# Patient Record
Sex: Male | Born: 1955 | Race: White | Hispanic: No | Marital: Married
Health system: Southern US, Community
[De-identification: ages and names within clinical notes are randomized; demographics above are authoritative.]

---

## 2018-06-02 ENCOUNTER — Other Ambulatory Visit: Payer: Self-pay | Admitting: Internal Medicine

## 2018-06-02 ENCOUNTER — Ambulatory Visit
Admission: RE | Admit: 2018-06-02 | Discharge: 2018-06-02 | Disposition: A | Discharge status: Home / Self Care | Payer: BLUE CROSS/BLUE SHIELD | Source: Ambulatory Visit | Attending: Internal Medicine | Admitting: Internal Medicine

## 2018-06-02 DIAGNOSIS — I10 Essential (primary) hypertension: Secondary | ICD-10-CM

## 2018-06-18 ENCOUNTER — Other Ambulatory Visit: Payer: Self-pay | Admitting: Internal Medicine

## 2018-06-18 ENCOUNTER — Ambulatory Visit
Admission: RE | Admit: 2018-06-18 | Discharge: 2018-06-18 | Disposition: A | Discharge status: Home / Self Care | Payer: BLUE CROSS/BLUE SHIELD | Source: Ambulatory Visit | Attending: Internal Medicine | Admitting: Internal Medicine

## 2018-06-18 DIAGNOSIS — R9389 Abnormal findings on diagnostic imaging of other specified body structures: Secondary | ICD-10-CM

## 2019-03-03 IMAGING — DX DG CHEST 2V
2 series · 2 of 2 positions shown · non-contrast
Comparison: 06/02/2018

CLINICAL DATA: Follow-up abnormal chest x-ray.

EXAM:
CHEST - 2 VIEW

[dg chest 2 view (1 of 2)]
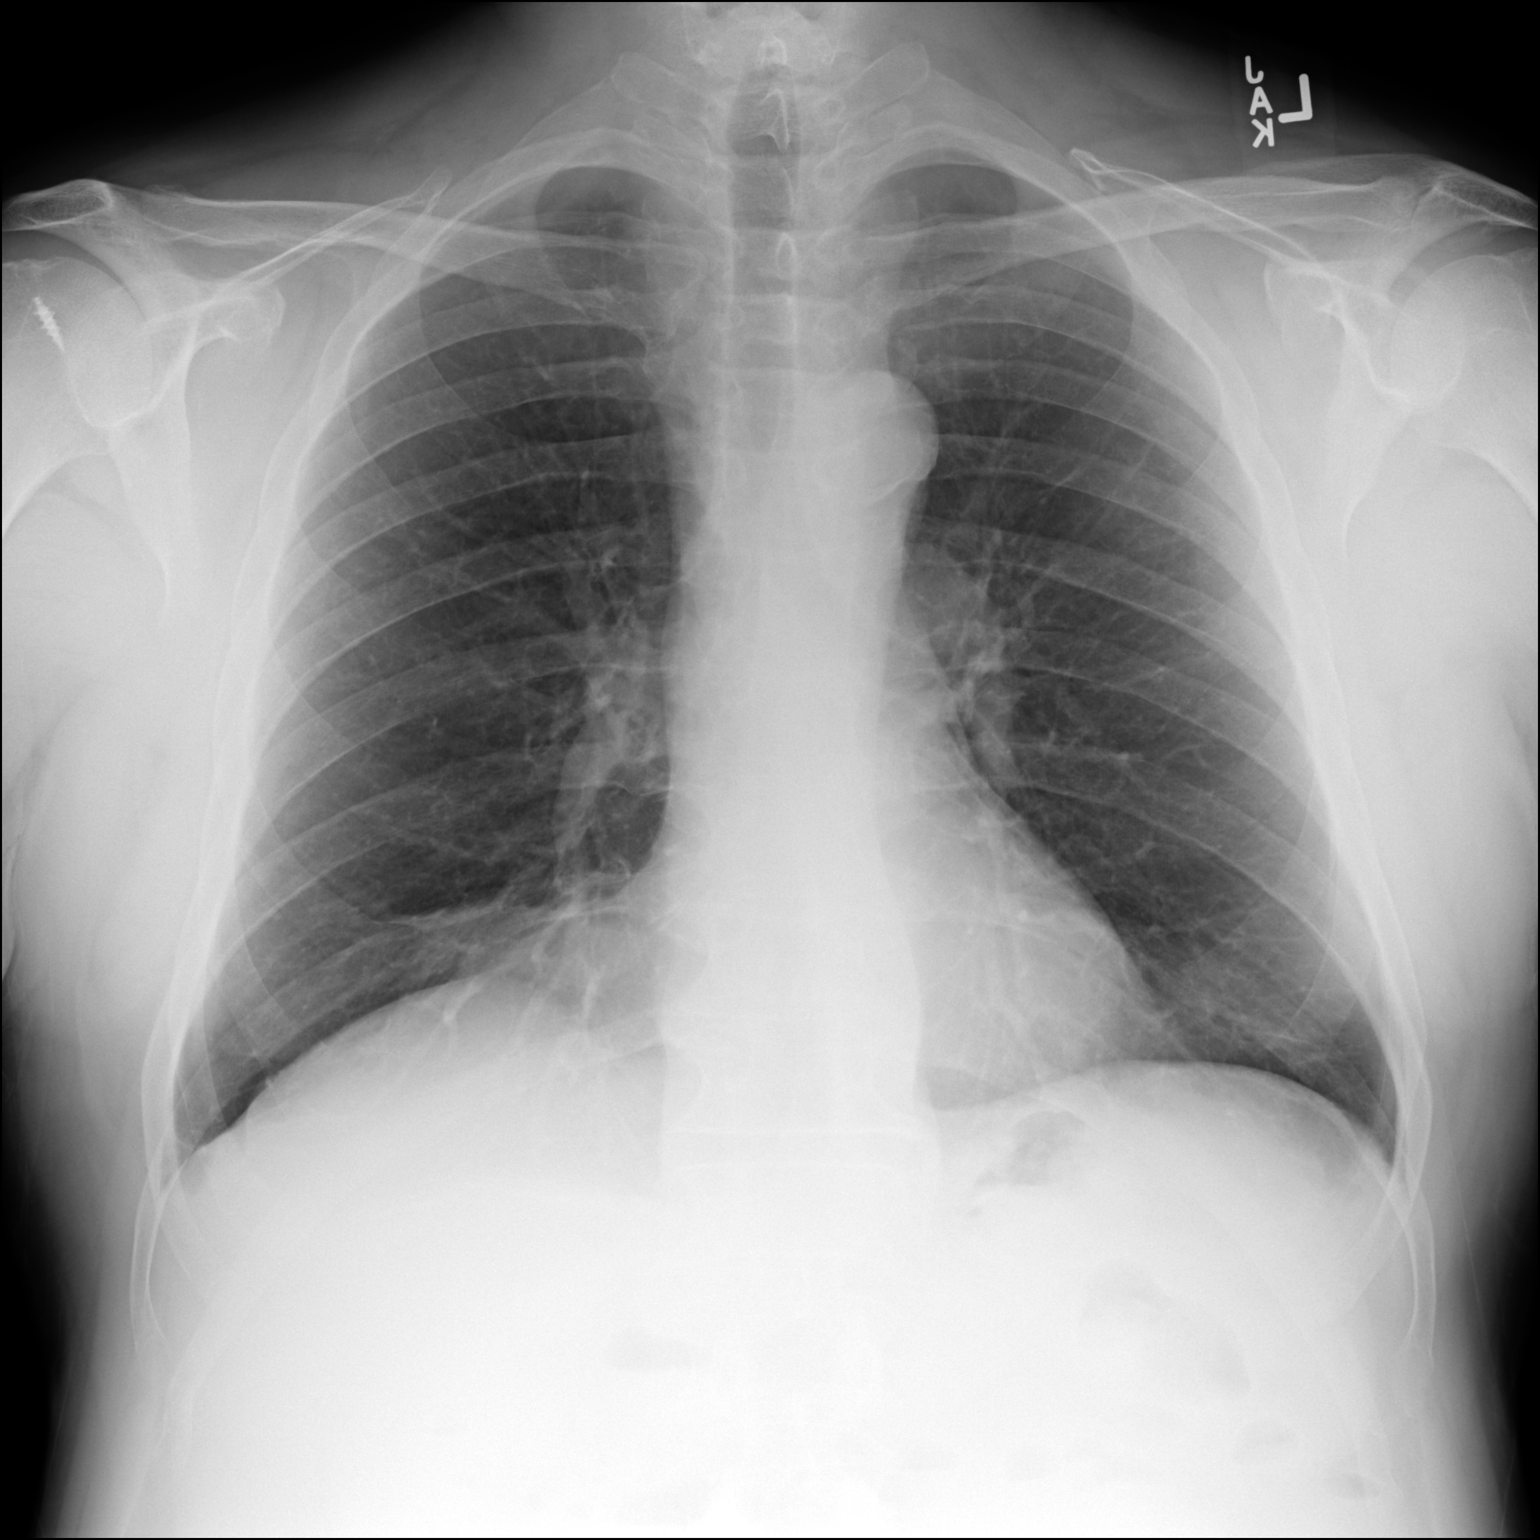

[dg chest 2 view (2 of 2)]
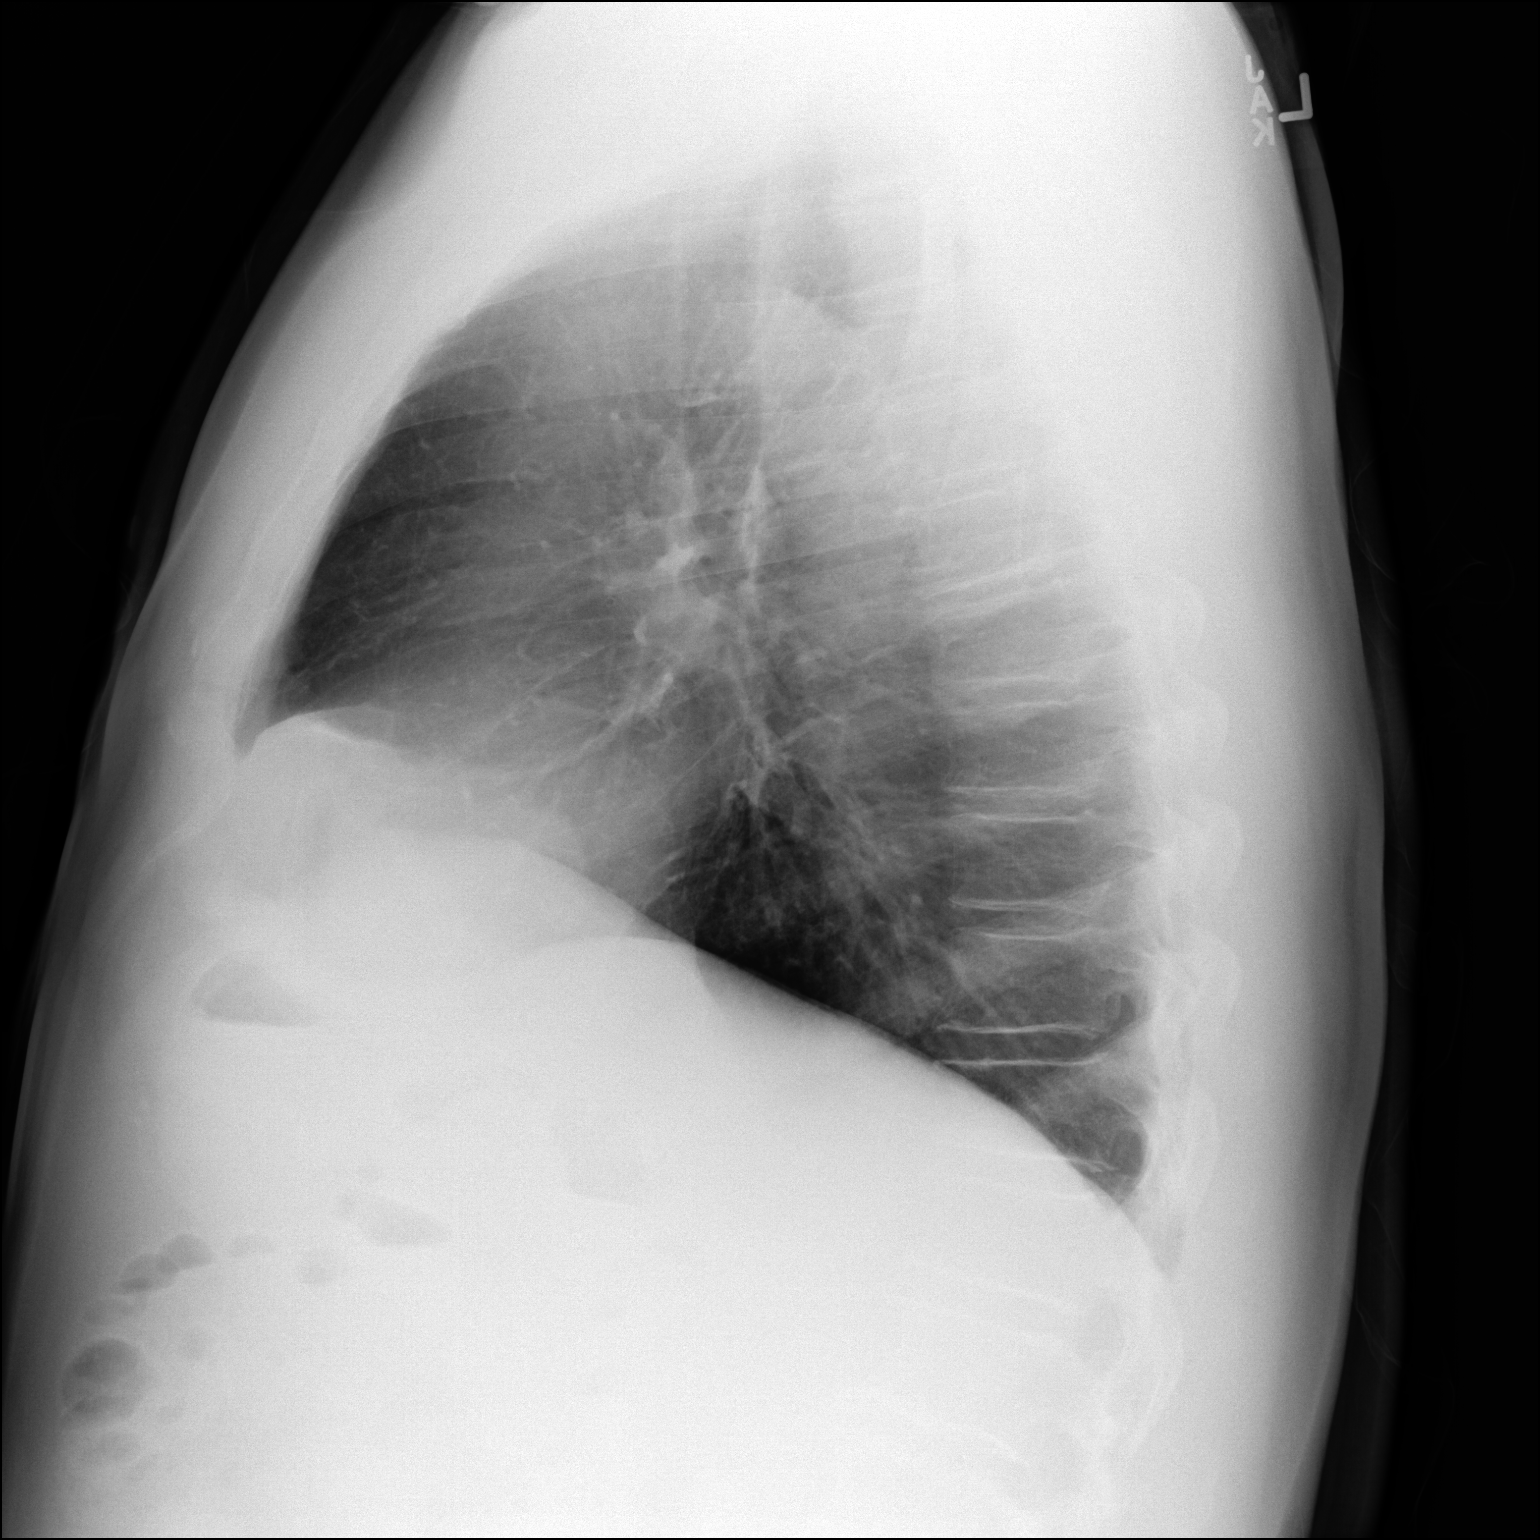

[2 of 2 positions shown; findings below may reference images not displayed]

FINDINGS: The cardiomediastinal silhouette is unchanged with normal heart
size. Aortic atherosclerosis is noted. A slightly prominent
mediastinal fat pad is present in the medial right lung base. Small
irregular density just superior to this on the prior study is not
discretely seen on today's study. Minimal linear opacity is present
in both lung bases. The lungs are clear elsewhere. No pleural
effusion or pneumothorax is identified. A suture anchor is noted in
the right humeral head.
IMPRESSION: 1. Minimal bibasilar atelectasis or scarring. No discrete lung
nodule is apparent.
2. Lungs otherwise clear.

## 2019-11-17 ENCOUNTER — Ambulatory Visit: Payer: BLUE CROSS/BLUE SHIELD | Admitting: Family Medicine

## 2020-12-08 ENCOUNTER — Other Ambulatory Visit: Payer: Self-pay | Admitting: Internal Medicine

## 2020-12-08 DIAGNOSIS — R9389 Abnormal findings on diagnostic imaging of other specified body structures: Secondary | ICD-10-CM

## 2020-12-08 DIAGNOSIS — R059 Cough, unspecified: Secondary | ICD-10-CM

## 2020-12-26 ENCOUNTER — Other Ambulatory Visit: Payer: BLUE CROSS/BLUE SHIELD

## 2020-12-26 ENCOUNTER — Ambulatory Visit
Admission: RE | Admit: 2020-12-26 | Discharge: 2020-12-26 | Disposition: A | Discharge status: Home / Self Care | Payer: BLUE CROSS/BLUE SHIELD | Source: Ambulatory Visit | Attending: Internal Medicine | Admitting: Internal Medicine

## 2020-12-26 ENCOUNTER — Other Ambulatory Visit: Payer: Self-pay

## 2020-12-26 DIAGNOSIS — R059 Cough, unspecified: Secondary | ICD-10-CM

## 2020-12-26 DIAGNOSIS — R9389 Abnormal findings on diagnostic imaging of other specified body structures: Secondary | ICD-10-CM

## 2020-12-26 MED ORDER — IOPAMIDOL (ISOVUE-300) INJECTION 61%
75.0000 mL | Freq: Once | INTRAVENOUS | Status: AC | PRN
  Administered 2020-12-26: 75 mL via INTRAVENOUS

## 2020-12-27 ENCOUNTER — Other Ambulatory Visit: Payer: BLUE CROSS/BLUE SHIELD

## 2021-10-04 ENCOUNTER — Emergency Department (HOSPITAL_COMMUNITY)
Admission: EM | Admit: 2021-10-04 | Discharge: 2021-10-05 | Disposition: A | Discharge status: Home / Self Care | Payer: Medicare Other | Attending: Emergency Medicine | Admitting: Emergency Medicine

## 2021-10-04 DIAGNOSIS — Z2914 Encounter for prophylactic rabies immune globin: Secondary | ICD-10-CM | POA: Diagnosis not present

## 2021-10-04 DIAGNOSIS — Z203 Contact with and (suspected) exposure to rabies: Secondary | ICD-10-CM | POA: Insufficient documentation

## 2021-10-04 DIAGNOSIS — Z23 Encounter for immunization: Secondary | ICD-10-CM | POA: Insufficient documentation

## 2021-10-04 DIAGNOSIS — Z209 Contact with and (suspected) exposure to unspecified communicable disease: Secondary | ICD-10-CM

## 2021-10-04 MED ORDER — RABIES IMMUNE GLOBULIN 150 UNIT/ML IM INJ
225.0000 [IU] | INJECTION | Freq: Once | INTRAMUSCULAR | Status: AC
  Administered 2021-10-04: 225 [IU] via INTRAMUSCULAR
  Filled 2021-10-04: qty 2

## 2021-10-04 MED ORDER — RABIES IMMUNE GLOBULIN 150 UNIT/ML IM INJ: 20.0000 [IU]/kg | INJECTION | Freq: Once | INTRAMUSCULAR | Status: DC

## 2021-10-04 MED ORDER — RABIES IMMUNE GLOBULIN 150 UNIT/ML IM INJ
1500.0000 [IU] | INJECTION | Freq: Once | INTRAMUSCULAR | Status: AC
  Administered 2021-10-04: 1500 [IU] via INTRAMUSCULAR
  Filled 2021-10-04: qty 10

## 2021-10-04 MED ORDER — RABIES VACCINE, PCEC IM SUSR
1.0000 mL | Freq: Once | INTRAMUSCULAR | Status: AC
  Administered 2021-10-04: 1 mL via INTRAMUSCULAR
  Filled 2021-10-04: qty 1

## 2021-10-04 NOTE — ED Provider Notes (Signed)
  [facility] EMERGENCY DEPARTMENT Provider Note   CSN: [id number] Arrival date & time: 10/04/21  1814     History  Chief Complaint  Patient presents with   Rabies Injection    Stephen Franco is a 66 y.o. male.  HPI   66 year old male presents emergency department for rabies vaccine.  Last night he and his wife woke up to a bat flying in the bedroom.  They do not believe that they were actually bitten by that but however health department referred them here for vaccination.  They have no symptoms or acute complaints at this time.  Home Medications Prior to Admission medications   Not on File      Allergies    Patient has no allergy information on record.    Review of Systems   Review of Systems  Constitutional:  Negative for fever.  Respiratory:  Negative for shortness of breath.   Cardiovascular:  Negative for chest pain.  Gastrointestinal:  Negative for abdominal pain.  Neurological:  Negative for headaches.   Physical Exam Updated Vital Signs BP 134/78 (BP Location: Right Arm)   Pulse 65   Temp 98.6 F (37 C) (Oral)   Resp 16   Wt 86.1 kg   SpO2 97%  Physical Exam Vitals and nursing note reviewed.  Constitutional:      Appearance: Normal appearance.  HENT:     Head: Normocephalic.     Mouth/Throat:     Mouth: Mucous membranes are moist.  Cardiovascular:     Rate and Rhythm: Normal rate.  Pulmonary:     Effort: Pulmonary effort is normal. No respiratory distress.  Skin:    General: Skin is warm.  Neurological:     Mental Status: He is alert and oriented to person, place, and time. Mental status is at baseline.  Psychiatric:        Mood and Affect: Mood normal.    ED Results / Procedures / Treatments   Labs (all labs ordered are listed, but only abnormal results are displayed) Labs Reviewed - No data to display  EKG None  Radiology No results found.  Procedures Procedures    Medications Ordered in ED Medications   rabies immune globulin (HYPERAB/KEDRAB) injection 1,725 Units (has no administration in time range)  rabies vaccine (RABAVERT) injection 1 mL (has no administration in time range)    ED Course/ Medical Decision Making/ A&P                           Medical Decision Making Risk Prescription drug management.   66 year old male presents emergency department for rabies treatment after finding a bat flying in the room while sleeping.  Recommendation is for receiving rabies immunoglobulin and vaccine.  Pharmacy consulted and administered.  Patient given follow-up form and information for completing the rabies vaccine regimen.  All questions answered.  Patient at this time appears safe and stable for discharge and close outpatient follow up. Discharge plan and strict return to ED precautions discussed, patient verbalizes understanding and agreement.        Final Clinical Impression(s) / ED Diagnoses Final diagnoses:  Need for prophylactic vaccination against rabies  Exposure to bat without known bite    Rx / DC Orders ED Discharge Orders     None         Rozelle Logan, DO 10/05/21 0007

## 2021-10-04 NOTE — ED Triage Notes (Signed)
Pt here for rabies vaccine, states bat was in bedroom last night, unsure if actually bitten but advised by PCP & Health Dept to get vaccinated. No symptoms, no physical complaints. ?

## 2021-10-04 NOTE — Discharge Instructions (Addendum)
You have been seen and given the rabies immunoglobulin and vaccine.  You need to complete this vaccine regimen and additional doses should be administered on day 3, 7 and 14.  You may follow-up at [facility] health urgent care center at [facility].  If you are traveling you may follow-up at any other urgent care, we do recommend that you call beforehand to ensure that they have a rabies vaccine available. ?

## 2021-10-07 ENCOUNTER — Ambulatory Visit (HOSPITAL_COMMUNITY)
Admission: RE | Admit: 2021-10-07 | Discharge: 2021-10-07 | Disposition: A | Discharge status: Still In Hospital | Payer: Self-pay | Source: Ambulatory Visit

## 2021-10-07 ENCOUNTER — Ambulatory Visit (HOSPITAL_COMMUNITY)
Admission: EM | Admit: 2021-10-07 | Discharge: 2021-10-07 | Disposition: A | Discharge status: Home / Self Care | Payer: Medicare Other | Attending: Physician Assistant | Admitting: Physician Assistant

## 2021-10-07 DIAGNOSIS — Z2914 Encounter for prophylactic rabies immune globin: Secondary | ICD-10-CM

## 2021-10-07 DIAGNOSIS — Z203 Contact with and (suspected) exposure to rabies: Secondary | ICD-10-CM

## 2021-10-07 DIAGNOSIS — Z23 Encounter for immunization: Secondary | ICD-10-CM

## 2021-10-07 MED ORDER — RABIES VACCINE, PCEC IM SUSR
INTRAMUSCULAR | Status: AC
  Filled 2021-10-07: qty 1

## 2021-10-07 MED ORDER — RABIES VACCINE, PCEC IM SUSR
1.0000 mL | Freq: Once | INTRAMUSCULAR | Status: AC
  Administered 2021-10-07: 1 mL via INTRAMUSCULAR

## 2021-10-07 NOTE — ED Triage Notes (Signed)
Pt is present today for 2nd rabies vaccine. Denies any pain or discomfort.

## 2021-10-11 ENCOUNTER — Ambulatory Visit (HOSPITAL_COMMUNITY)
Admission: EM | Admit: 2021-10-11 | Discharge: 2021-10-11 | Disposition: A | Discharge status: Home / Self Care | Payer: BLUE CROSS/BLUE SHIELD | Attending: Internal Medicine | Admitting: Internal Medicine

## 2021-10-11 DIAGNOSIS — Z203 Contact with and (suspected) exposure to rabies: Secondary | ICD-10-CM

## 2021-10-11 MED ORDER — RABIES VACCINE, PCEC IM SUSR
INTRAMUSCULAR | Status: AC
  Filled 2021-10-11: qty 1

## 2021-10-11 MED ORDER — RABIES VACCINE, PCEC IM SUSR
1.0000 mL | Freq: Once | INTRAMUSCULAR | Status: AC
  Administered 2021-10-11: 1 mL via INTRAMUSCULAR

## 2021-10-11 NOTE — ED Triage Notes (Signed)
5/17 initial vaccines, bat exposure.  Today is day #7 in rabies series

## 2021-10-11 NOTE — Discharge Instructions (Signed)
Return as scheduled.  Return sooner with any questions or concerns

## 2021-10-18 ENCOUNTER — Ambulatory Visit (HOSPITAL_COMMUNITY)
Admission: RE | Admit: 2021-10-18 | Discharge: 2021-10-18 | Disposition: A | Discharge status: Home / Self Care | Payer: Medicare Other | Source: Ambulatory Visit | Attending: Family Medicine | Admitting: Family Medicine

## 2021-10-18 ENCOUNTER — Ambulatory Visit (HOSPITAL_COMMUNITY): Payer: BLUE CROSS/BLUE SHIELD

## 2021-10-18 DIAGNOSIS — Z203 Contact with and (suspected) exposure to rabies: Secondary | ICD-10-CM | POA: Diagnosis not present

## 2021-10-18 MED ORDER — RABIES VACCINE, PCEC IM SUSR
INTRAMUSCULAR | Status: AC
  Filled 2021-10-18: qty 1

## 2021-10-18 MED ORDER — RABIES VACCINE, PCEC IM SUSR
1.0000 mL | Freq: Once | INTRAMUSCULAR | Status: AC
  Administered 2021-10-18: 1 mL via INTRAMUSCULAR

## 2021-10-18 NOTE — ED Triage Notes (Signed)
Pt is present today for 4th rabies injection. Denies any pain

## 2022-11-24 ENCOUNTER — Encounter (HOSPITAL_COMMUNITY): Payer: Self-pay | Admitting: Emergency Medicine

## 2022-11-24 ENCOUNTER — Other Ambulatory Visit: Payer: Self-pay

## 2022-11-24 ENCOUNTER — Emergency Department (HOSPITAL_COMMUNITY): Payer: Medicare Other

## 2022-11-24 ENCOUNTER — Emergency Department (HOSPITAL_COMMUNITY)
Admission: EM | Admit: 2022-11-24 | Discharge: 2022-11-24 | Disposition: A | Discharge status: Home / Self Care | Payer: Medicare Other | Attending: Emergency Medicine | Admitting: Emergency Medicine

## 2022-11-24 DIAGNOSIS — S61303A Unspecified open wound of left middle finger with damage to nail, initial encounter: Secondary | ICD-10-CM | POA: Insufficient documentation

## 2022-11-24 DIAGNOSIS — S6990XA Unspecified injury of unspecified wrist, hand and finger(s), initial encounter: Secondary | ICD-10-CM

## 2022-11-24 DIAGNOSIS — S61305A Unspecified open wound of left ring finger with damage to nail, initial encounter: Secondary | ICD-10-CM | POA: Diagnosis not present

## 2022-11-24 DIAGNOSIS — S6992XA Unspecified injury of left wrist, hand and finger(s), initial encounter: Secondary | ICD-10-CM | POA: Diagnosis present

## 2022-11-24 DIAGNOSIS — W294XXA Contact with nail gun, initial encounter: Secondary | ICD-10-CM | POA: Insufficient documentation

## 2022-11-24 DIAGNOSIS — E119 Type 2 diabetes mellitus without complications: Secondary | ICD-10-CM | POA: Insufficient documentation

## 2022-11-24 MED ORDER — LIDOCAINE HCL (PF) 1 % IJ SOLN
30.0000 mL | Freq: Once | INTRAMUSCULAR | Status: AC
Start: 2022-11-24 — End: 2022-11-24
  Administered 2022-11-24: 30 mL
  Filled 2022-11-24: qty 30

## 2022-11-24 NOTE — ED Notes (Signed)
Nail removed by provider.  Fingers covered with telfa and 2x2's and wrapped with kling.

## 2022-11-24 NOTE — Discharge Instructions (Addendum)
Clean your wounds on your fingers with soap and water. You can wrap with dressing or a bandage as well.  More information on wound care attached in the discharge summary.  You can alternate between Ibuprofen and Tylenol every 4 hours for pain.   Information for Dr. Merlyn Lot, hand surgeon, provided. You can also follow up with [facility] for someplace that is closer to where you live.  Get help right away if: You suddenly have very bad pain in your hand. You had feeling in your hand before but you suddenly lose feeling. Your wrist or hand becomes bent (contracted)without you trying to bend it. Your symptoms had gotten better and they suddenly get worse. Your hand or fingers are turning pink or blue.

## 2022-11-24 NOTE — ED Provider Triage Note (Signed)
Emergency Medicine Provider Triage Evaluation Note  Stephen Franco , a 67 y.o. male  was evaluated in triage.  Pt complains of left hand pain.  He was putting trim up at his house with a nail gun when he accidentally shot a nail into his left index and middle fingers.  No blood thinners.  No numbness, weakness or tingling.  Only reports pain when he touches the fingers.  Believes his tetanus vaccine was updated in 1 to 2 years ago but is unsure.  Review of Systems  Positive: As above Negative: As above  Physical Exam  BP 130/85   Pulse 71   Temp 98.3 F (36.8 C) (Oral)   Resp 14   SpO2 92%  Gen:   Awake, no distress   Resp:  Normal effort  MSK:   Moves extremities without difficulty  Other:  Nail through the index and middle fingers, overlying the distal phalanx  Medical Decision Making  Medically screening exam initiated at 3:41 PM.  Appropriate orders placed.  Ozell Strojny Stalker was informed that the remainder of the evaluation will be completed by another provider, this initial triage assessment does not replace that evaluation, and the importance of remaining in the ED until their evaluation is complete.  Imaging ordered   Mora Bellman 11/24/22 1542

## 2022-11-24 NOTE — ED Provider Notes (Signed)
[place] EMERGENCY DEPARTMENT AT [facility] Provider Note   CSN: [id number] Arrival date & time: 11/24/22  1515     History {Add pertinent medical, surgical, social history, OB history to HPI:1} Chief Complaint  Patient presents with   Nail in hand    Stephen Franco is a 67 y.o. male with a history of diabetes who presents to the ED today for a nail in his left fingers.  Patient was using a nail gun outside earlier today when he accidentally got a nail in his left index and middle finger.  The nail is in the distal aspect of the fingers.  He maintains full range of motion of his digits without numbness, tingling, or loss of sensation.  He is not on blood thinners.  Tetanus vaccination is up-to-date.  No other complaints or concerns at this time.    Home Medications Prior to Admission medications   Not on File      Allergies    Patient has no known allergies.    Review of Systems   Review of Systems  Skin:        Nail and tip of second and third finger on the left hand  All other systems reviewed and are negative.   Physical Exam Updated Vital Signs BP 130/85   Pulse 71   Temp 98.3 F (36.8 C) (Oral)   Resp 14   SpO2 92%  Physical Exam Vitals and nursing note reviewed.  Constitutional:      Appearance: Normal appearance.  HENT:     Head: Normocephalic and atraumatic.  Eyes:     Conjunctiva/sclera: Conjunctivae normal.     Pupils: Pupils are equal, round, and reactive to light.  Cardiovascular:     Rate and Rhythm: Normal rate and regular rhythm.     Pulses: Normal pulses.  Pulmonary:     Effort: Pulmonary effort is normal.     Breath sounds: Normal breath sounds.  Musculoskeletal:        General: Tenderness and signs of injury present. Normal range of motion.     Comments: Nail in palmar aspect of left second and third fingers just distal to the DIP joints.  Skin:    General: Skin is warm and dry.     Capillary Refill: Capillary refill takes  less than 2 seconds.     Findings: No rash.  Neurological:     General: No focal deficit present.     Mental Status: He is alert.  Psychiatric:        Mood and Affect: Mood normal.        Behavior: Behavior normal.     ED Results / Procedures / Treatments   Labs (all labs ordered are listed, but only abnormal results are displayed) Labs Reviewed - No data to display  EKG None  Radiology DG Hand Complete Left  Result Date: 11/24/2022 CLINICAL DATA:  Nail through index and middle fingers. EXAM: LEFT HAND - COMPLETE 3+ VIEW COMPARISON:  None Available. FINDINGS: Nail extends through the plantar soft tissues of the distal index and middle fingers. No fracture. Joints are normally aligned. IMPRESSION: 1. No fracture. 2. Nail passes through the plantar soft tissues of the distal index and middle fingers. Electronically Signed   By: Amie Portland M.D.   On: 11/24/2022 16:30    Procedures Procedures  {Document cardiac monitor, telemetry assessment procedure when appropriate:1}  Medications Ordered in ED Medications - No data to display  ED Course/ Medical  Decision Making/ A&P   {   Click here for ABCD2, HEART and other calculatorsREFRESH Note before signing :1}                          Medical Decision Making  ***  {Document critical care time when appropriate:1} {Document review of labs and clinical decision tools ie heart score, Chads2Vasc2 etc:1}  {Document your independent review of radiology images, and any outside records:1} {Document your discussion with family members, caretakers, and with consultants:1} {Document social determinants of health affecting pt's care:1} {Document your decision making why or why not admission, treatments were needed:1} Final Clinical Impression(s) / ED Diagnoses Final diagnoses:  None    Rx / DC Orders ED Discharge Orders     None

## 2022-11-24 NOTE — ED Triage Notes (Signed)
Pt presents with nail completely through index and middle fingers.  Bleeding controlled.  States he was using a nail gun to put up trim. Unknown last tetanus. Is a diabetic.
